# Patient Record
Sex: Male | Born: 1970 | Race: Black or African American | Hispanic: No | Marital: Single | Smoking: Current every day smoker
Health system: Southern US, Community
[De-identification: ages and names within clinical notes are randomized; demographics above are authoritative.]

---

## 2004-06-27 ENCOUNTER — Emergency Department: Payer: Self-pay | Admitting: Internal Medicine

## 2004-10-23 ENCOUNTER — Emergency Department: Payer: Self-pay | Admitting: Emergency Medicine

## 2005-09-15 ENCOUNTER — Emergency Department: Payer: Self-pay | Admitting: General Practice

## 2006-03-03 ENCOUNTER — Emergency Department: Payer: Self-pay | Admitting: Emergency Medicine

## 2006-03-04 ENCOUNTER — Other Ambulatory Visit: Payer: Self-pay

## 2006-10-02 ENCOUNTER — Emergency Department: Payer: Self-pay | Admitting: Emergency Medicine

## 2006-10-20 ENCOUNTER — Emergency Department: Payer: Self-pay | Admitting: Emergency Medicine

## 2008-10-27 ENCOUNTER — Emergency Department: Payer: Self-pay | Admitting: Internal Medicine

## 2008-12-23 ENCOUNTER — Ambulatory Visit: Payer: Self-pay | Admitting: Internal Medicine

## 2009-01-02 ENCOUNTER — Ambulatory Visit: Payer: Self-pay | Admitting: Oncology

## 2009-01-23 ENCOUNTER — Ambulatory Visit: Payer: Self-pay | Admitting: Internal Medicine

## 2009-01-23 ENCOUNTER — Ambulatory Visit: Payer: Self-pay | Admitting: Oncology

## 2010-11-12 ENCOUNTER — Emergency Department: Payer: Self-pay | Admitting: Unknown Physician Specialty

## 2011-07-15 ENCOUNTER — Emergency Department: Payer: Self-pay | Admitting: Emergency Medicine

## 2011-11-15 ENCOUNTER — Emergency Department: Payer: Self-pay | Admitting: Emergency Medicine

## 2012-09-29 ENCOUNTER — Emergency Department: Payer: Self-pay | Admitting: Emergency Medicine

## 2016-10-23 ENCOUNTER — Encounter: Payer: Self-pay | Admitting: Emergency Medicine

## 2016-10-23 ENCOUNTER — Emergency Department
Admission: EM | Admit: 2016-10-23 | Discharge: 2016-10-23 | Disposition: A | Discharge status: Home / Self Care | Payer: Self-pay | Attending: Emergency Medicine | Admitting: Emergency Medicine

## 2016-10-23 DIAGNOSIS — F17200 Nicotine dependence, unspecified, uncomplicated: Secondary | ICD-10-CM | POA: Insufficient documentation

## 2016-10-23 DIAGNOSIS — Y929 Unspecified place or not applicable: Secondary | ICD-10-CM | POA: Insufficient documentation

## 2016-10-23 DIAGNOSIS — S30861A Insect bite (nonvenomous) of abdominal wall, initial encounter: Secondary | ICD-10-CM | POA: Insufficient documentation

## 2016-10-23 DIAGNOSIS — W57XXXA Bitten or stung by nonvenomous insect and other nonvenomous arthropods, initial encounter: Secondary | ICD-10-CM | POA: Insufficient documentation

## 2016-10-23 DIAGNOSIS — S30860A Insect bite (nonvenomous) of lower back and pelvis, initial encounter: Secondary | ICD-10-CM | POA: Insufficient documentation

## 2016-10-23 DIAGNOSIS — Y939 Activity, unspecified: Secondary | ICD-10-CM | POA: Insufficient documentation

## 2016-10-23 DIAGNOSIS — Y999 Unspecified external cause status: Secondary | ICD-10-CM | POA: Insufficient documentation

## 2016-10-23 MED ORDER — DOXYCYCLINE HYCLATE 100 MG PO CAPS: 100.0000 mg | ORAL_CAPSULE | Freq: Two times a day (BID) | ORAL | 0 refills | Status: DC

## 2016-10-23 NOTE — ED Triage Notes (Signed)
Says pulled several  Ticks off yesterday and today and unsure if head came out.  No fever or illness

## 2016-10-23 NOTE — ED Notes (Signed)
E-signature pad not working, pt verbalized understanding of discharge information. 

## 2016-10-23 NOTE — Discharge Instructions (Signed)
Begin taking doxycycline twice a day for the next 10 days. Do not spit and prolonged time out in some light as you  could blister. Follow-up with [facility] acute-care if any continued problems. Use Benadryl Cream to area as needed for itching

## 2016-10-23 NOTE — ED Provider Notes (Signed)
[facility] Emergency Department Provider Note ____________________________________________  Time seen: 12:40 PM  I have reviewed the triage vital signs and the nursing notes.  HISTORY  Chief Complaint  Tick Removal   HPI Dillon Parker is a 46 y.o. male is here with family member with complaint of pulling off several ticks yesterday and today. He is unsure if he "got the head out". Wife with the patient today and states that when the tick was pulled out that she saw a pink area that did not appear that the head was still in. She did not look at the head but threw it on the ground and they set the tick on fire. Wife states that the tick did have a white spot on it prior to throwing it on the ground. He now has 2 areas on his back and one on his left abdomen that is extremely tender to touch. Patient is unaware of any fever.  History reviewed. No pertinent past medical history.  There are no active problems to display for this patient.   History reviewed. No pertinent surgical history.  Prior to Admission medications   Medication Sig Start Date End Date Taking? Authorizing Provider  doxycycline (VIBRAMYCIN) 100 MG capsule Take 1 capsule (100 mg total) by mouth 2 (two) times daily. 10/23/16   Tommi RumpsSummers, Rhonda L, PA-C    Allergies Patient has no allergy information on record.  No family history on file.  Social History Social History  Substance Use Topics  . Smoking status: Current Every Day Smoker  . Smokeless tobacco: Never Used  . Alcohol use Yes    Review of Systems  Constitutional: Negative for fever. Cardiovascular: Negative for chest pain. Respiratory: Negative for shortness of breath. Musculoskeletal: Negative for back pain. Skin: Positive skin rash. Neurological: Negative for headaches, focal weakness or numbness. ____________________________________________  PHYSICAL EXAM:  VITAL SIGNS: ED Triage Vitals  Enc Vitals Group     BP  10/23/16 1203 127/67     Pulse Rate 10/23/16 1203 93     Resp 10/23/16 1203 14     Temp 10/23/16 1203 98.3 F (36.8 C)     Temp Source 10/23/16 1203 Oral     SpO2 10/23/16 1203 97 %     Weight 10/23/16 1204 160 lb (72.6 kg)     Height 10/23/16 1204 6\' 1"  (1.854 m)     Head Circumference --      Peak Flow --      Pain Score 10/23/16 1203 5     Pain Loc --      Pain Edu? --      Excl. in GC? --     Constitutional: Alert and oriented. Well appearing and in no distress. Head: Normocephalic and atraumatic. Eyes: Conjunctivae are normal.  Neck: Supple.  Hematological/Lymphatic/Immunological: No cervical lymphadenopathy. Cardiovascular: Normal rate, regular rhythm. Normal distal pulses. Respiratory: Normal respiratory effort. No wheezes/rales/rhonchi. Musculoskeletal: Nontender with normal range of motion in all extremities.  Neurologic:  Normal gait.  Normal speech and language. No gross focal neurologic deficits are appreciated. Skin:  Skin is warm, dry and intact. Posterior mid back there is a very tender nodule present with skin intact. On the left abdomen there is an area with erythema suggestive of a bite. No drainage is active. Area is very tender to touch. Psychiatric: Mood and affect are normal. Patient exhibits appropriate insight and judgment.   INITIAL IMPRESSION / ASSESSMENT AND PLAN / ED COURSE  Area on the abdomen  was unroofed with an 18-gauge needle and no remaining tick particles were seen in the area. Patient was placed on doxycycline 100 mg twice a day for 10 days. Patient is encouraged to completely finish the course of antibiotics. He is to follow-up with [facility] acute-care if any continued problems.    ____________________________________________  FINAL CLINICAL IMPRESSION(S) / ED DIAGNOSES  Final diagnoses:  Tick bite of abdomen, initial encounter  Tick bite of back, initial encounter     Tommi Rumps, PA-C 10/23/16 1546    Jeanmarie Plant, MD 10/23/16 1622

## 2017-04-13 ENCOUNTER — Emergency Department (HOSPITAL_COMMUNITY): Payer: Self-pay

## 2017-04-13 ENCOUNTER — Encounter (HOSPITAL_COMMUNITY): Payer: Self-pay | Admitting: *Deleted

## 2017-04-13 ENCOUNTER — Emergency Department (HOSPITAL_COMMUNITY)
Admission: EM | Admit: 2017-04-13 | Discharge: 2017-04-13 | Disposition: A | Discharge status: Home / Self Care | Payer: Self-pay | Attending: Emergency Medicine | Admitting: Emergency Medicine

## 2017-04-13 ENCOUNTER — Other Ambulatory Visit: Payer: Self-pay

## 2017-04-13 ENCOUNTER — Emergency Department (HOSPITAL_BASED_OUTPATIENT_CLINIC_OR_DEPARTMENT_OTHER)
Admit: 2017-04-13 | Discharge: 2017-04-13 | Disposition: A | Discharge status: Home / Self Care | Payer: Self-pay | Attending: Emergency Medicine | Admitting: Emergency Medicine

## 2017-04-13 DIAGNOSIS — Z7982 Long term (current) use of aspirin: Secondary | ICD-10-CM | POA: Insufficient documentation

## 2017-04-13 DIAGNOSIS — R079 Chest pain, unspecified: Secondary | ICD-10-CM | POA: Insufficient documentation

## 2017-04-13 DIAGNOSIS — M79609 Pain in unspecified limb: Secondary | ICD-10-CM

## 2017-04-13 DIAGNOSIS — I809 Phlebitis and thrombophlebitis of unspecified site: Secondary | ICD-10-CM

## 2017-04-13 DIAGNOSIS — F17210 Nicotine dependence, cigarettes, uncomplicated: Secondary | ICD-10-CM | POA: Insufficient documentation

## 2017-04-13 DIAGNOSIS — I8001 Phlebitis and thrombophlebitis of superficial vessels of right lower extremity: Secondary | ICD-10-CM | POA: Insufficient documentation

## 2017-04-13 LAB — CBC
HCT: 41.9 % (ref 39.0–52.0)
Hemoglobin: 13.5 g/dL (ref 13.0–17.0)
MCH: 29.5 pg (ref 26.0–34.0)
MCHC: 32.2 g/dL (ref 30.0–36.0)
MCV: 91.5 fL (ref 78.0–100.0)
PLATELETS: 303 10*3/uL (ref 150–400)
RBC: 4.58 MIL/uL (ref 4.22–5.81)
RDW: 13 % (ref 11.5–15.5)
WBC: 9.4 10*3/uL (ref 4.0–10.5)

## 2017-04-13 LAB — BASIC METABOLIC PANEL
Anion gap: 4 — ABNORMAL LOW (ref 5–15)
CALCIUM: 8.9 mg/dL (ref 8.9–10.3)
CHLORIDE: 107 mmol/L (ref 101–111)
CO2: 28 mmol/L (ref 22–32)
CREATININE: 1.02 mg/dL (ref 0.61–1.24)
GFR calc non Af Amer: >60 mL/min (ref >60)
Glucose, Bld: 100 mg/dL — ABNORMAL HIGH (ref 65–99)
Potassium: 4.3 mmol/L (ref 3.5–5.1)
SODIUM: 139 mmol/L (ref 135–145)

## 2017-04-13 LAB — I-STAT TROPONIN, ED: TROPONIN I, POC: 0 ng/mL (ref 0.00–0.08)

## 2017-04-13 MED ORDER — ASPIRIN EC 325 MG PO TBEC: 325.0000 mg | DELAYED_RELEASE_TABLET | Freq: Four times a day (QID) | ORAL | 0 refills | Status: AC | PRN

## 2017-04-13 NOTE — ED Triage Notes (Signed)
Pt also states he has been having chest pain that comes and goes

## 2017-04-13 NOTE — ED Triage Notes (Signed)
Pt is here with bite to right foot one on each ankle.  Pt has hard vein for the last 2 weeks and now his right right knee hurt.  Palpable DPP.

## 2017-04-13 NOTE — Progress Notes (Signed)
*  PRELIMINARY RESULTS* Vascular Ultrasound Right lower extremity venous duplex has been completed.  Preliminary findings: No evidence of deep vein thrombosis or baker's cyst in the right lower extremity.  Large segment of superficial vein thrombosis noted in the right greater saphenous vein from mid upper thigh to mid calf.  Preliminary resutls given to patients RN @ 19:15  Chauncey FischerCharlotte C Cashe Gatt 04/13/2017, 7:13 PM

## 2017-04-13 NOTE — ED Provider Notes (Signed)
[facility] EMERGENCY DEPARTMENT Provider Note   CSN: [id number] Arrival date & time: 04/13/17  1252     History   Chief Complaint Chief Complaint  Patient presents with  . Chest Pain  . Knee Pain    HPI Dillon Parker is a 46 y.o. male.  HPI Pt states he started having pain after getting a bite on his leg around the ankles.  He noticed some drainage.  The drainage and swelling resolved but then his vein started to look dark and tender.    No shortness of breath.  No fever.   He has had some intermittent chest pain.   Sharp pain on the left side of his chest that lasts a few minutes at a time. Nothing seems to bring it on.  Exertion does not bother hi m. History reviewed. No pertinent past medical history.  There are no active problems to display for this patient.   History reviewed. No pertinent surgical history.     Home Medications    Prior to Admission medications   Medication Sig Start Date End Date Taking? Authorizing Provider  ibuprofen (ADVIL,MOTRIN) 200 MG tablet Take 400 mg by mouth every 6 (six) hours as needed for headache.   Yes [provider]  aspirin EC 325 MG tablet Take 1 tablet (325 mg total) by mouth every 6 (six) hours as needed. 04/13/17   Linwood DibblesKnapp, Cord Wilczynski, MD  doxycycline (VIBRAMYCIN) 100 MG capsule Take 1 capsule (100 mg total) by mouth 2 (two) times daily. Patient not taking: Reported on 04/13/2017 10/23/16   Tommi RumpsSummers, Rhonda L, PA-C    Family History No family history on file.  Social History Social History   Tobacco Use  . Smoking status: Current Every Day Smoker  . Smokeless tobacco: Never Used  Substance Use Topics  . Alcohol use: Yes    Comment: daily  . Drug use: Yes    Types: Marijuana     Allergies   Patient has no known allergies.   Review of Systems Review of Systems  All other systems reviewed and are negative.    Physical Exam Updated Vital Signs BP 117/75 (BP Location: Right Arm)   Pulse 66    Temp 98.5 F (36.9 C) (Oral)   Resp 14   Ht 1.829 m (6')   Wt 72.6 kg (160 lb)   SpO2 98%   BMI 21.70 kg/m   Physical Exam  Constitutional: He appears well-developed and well-nourished. No distress.  HENT:  Head: Normocephalic and atraumatic.  Right Ear: External ear normal.  Left Ear: External ear normal.  Eyes: Conjunctivae are normal. Right eye exhibits no discharge. Left eye exhibits no discharge. No scleral icterus.  Neck: Neck supple. No tracheal deviation present.  Cardiovascular: Normal rate, regular rhythm and intact distal pulses.  Pulmonary/Chest: Effort normal and breath sounds normal. No stridor. No respiratory distress. He has no wheezes. He has no rales.  Abdominal: Soft. Bowel sounds are normal. He exhibits no distension. There is no tenderness. There is no rebound and no guarding.  Musculoskeletal: He exhibits no edema.       Right lower leg: He exhibits tenderness. He exhibits no edema.  Indurated varicose veins anterior and medial aspect lower leg on right, ttp, mild ttp calf, no gross edema  Neurological: He is alert. He has normal strength. No cranial nerve deficit (no facial droop, extraocular movements intact, no slurred speech) or sensory deficit. He exhibits normal muscle tone. He displays no seizure  activity. Coordination normal.  Skin: Skin is warm and dry. No rash noted.  Psychiatric: He has a normal mood and affect.  Nursing note and vitals reviewed.    ED Treatments / Results  Labs (all labs ordered are listed, but only abnormal results are displayed) Labs Reviewed  BASIC METABOLIC PANEL - Abnormal; Notable for the following components:      Result Value   Glucose, Bld 100 (*)    BUN <5 (*)    Anion gap 4 (*)    All other components within normal limits  CBC  I-STAT TROPONIN, ED    EKG  EKG Interpretation  Date/Time:  Tuesday April 13 2017 14:06:07 EST Ventricular Rate:  69 PR Interval:  116 QRS Duration: 80 QT Interval:  382 QTC  Calculation: 409 R Axis:   74 Text Interpretation:  Normal sinus rhythm Normal ECG When compared with ECG of 03/04/2006, No significant change was found Confirmed by Dione Booze ([id number]) on 04/13/2017 3:45:55 PM       Radiology Dg Chest 2 View  Result Date: 04/13/2017 CLINICAL DATA:  Chest pain EXAM: CHEST  2 VIEW COMPARISON:  None. FINDINGS: Lungs are clear. The heart size and pulmonary vascularity are normal. No adenopathy. No pneumothorax. No bone lesions. A tiny metallic foreign body is noted in the anterior chest wall on the left medially. IMPRESSION: No edema or consolidation. Electronically Signed   By: Bretta Bang III M.D.   On: 04/13/2017 14:38    Procedures Procedures (including critical care time)  Medications Ordered in ED Medications - No data to display   Initial Impression / Assessment and Plan / ED Course  I have reviewed the triage vital signs and the nursing notes.  Pertinent labs & imaging results that were available during my care of the patient were reviewed by me and considered in my medical decision making (see chart for details).   Patient's laboratory tests are reassuring.  His Doppler study demonstrates a superficial vein thrombosis but no evidence of DVT.  No signs of infection. plan on discharge home with aspirin.  Outpatient follow-up with the primary care doctor.  Final Clinical Impressions(s) / ED Diagnoses   Final diagnoses:  Thrombophlebitis    ED Discharge Orders        Ordered    aspirin EC 325 MG tablet  Every 6 hours PRN     04/13/17 1942       Linwood Dibbles, MD 04/13/17 1943

## 2017-04-13 NOTE — Discharge Instructions (Signed)
Take the aspirin as prescribed, the sx should resolve over the next or two, follow up with a primary care doctor if it is not improving

## 2018-07-18 ENCOUNTER — Encounter: Payer: Self-pay | Admitting: Emergency Medicine

## 2018-07-18 ENCOUNTER — Other Ambulatory Visit: Payer: Self-pay

## 2018-07-18 ENCOUNTER — Emergency Department: Payer: Self-pay

## 2018-07-18 ENCOUNTER — Emergency Department
Admission: EM | Admit: 2018-07-18 | Discharge: 2018-07-18 | Disposition: A | Discharge status: Home / Self Care | Payer: Self-pay | Attending: Student in an Organized Health Care Education/Training Program | Admitting: Student in an Organized Health Care Education/Training Program

## 2018-07-18 DIAGNOSIS — G8929 Other chronic pain: Secondary | ICD-10-CM

## 2018-07-18 DIAGNOSIS — M5442 Lumbago with sciatica, left side: Secondary | ICD-10-CM | POA: Insufficient documentation

## 2018-07-18 DIAGNOSIS — R52 Pain, unspecified: Secondary | ICD-10-CM

## 2018-07-18 DIAGNOSIS — F17200 Nicotine dependence, unspecified, uncomplicated: Secondary | ICD-10-CM | POA: Insufficient documentation

## 2018-07-18 LAB — URINALYSIS, ROUTINE W REFLEX MICROSCOPIC
BILIRUBIN URINE: NEGATIVE
GLUCOSE, UA: NEGATIVE mg/dL
Hgb urine dipstick: NEGATIVE
Ketones, ur: NEGATIVE mg/dL
Leukocytes,Ua: NEGATIVE
Nitrite: NEGATIVE
PROTEIN: NEGATIVE mg/dL
SPECIFIC GRAVITY, URINE: 1.015 (ref 1.005–1.030)
pH: 6 (ref 5.0–8.0)

## 2018-07-18 MED ORDER — NAPROXEN 500 MG PO TABS
500.0000 mg | ORAL_TABLET | Freq: Once | ORAL | Status: AC
  Administered 2018-07-18: 500 mg via ORAL
  Filled 2018-07-18: qty 1

## 2018-07-18 MED ORDER — HYDROCODONE-ACETAMINOPHEN 5-325 MG PO TABS
1.0000 | ORAL_TABLET | Freq: Once | ORAL | Status: AC
Start: 2018-07-18 — End: 2018-07-18
  Administered 2018-07-18: 1 via ORAL
  Filled 2018-07-18: qty 1

## 2018-07-18 MED ORDER — NAPROXEN 500 MG PO TABS: 500.0000 mg | ORAL_TABLET | Freq: Two times a day (BID) | ORAL | 0 refills | Status: AC

## 2018-07-18 MED ORDER — LIDOCAINE 5 % EX PTCH
1.0000 | MEDICATED_PATCH | CUTANEOUS | Status: DC
  Administered 2018-07-18: 1 via TRANSDERMAL
  Filled 2018-07-18: qty 1

## 2018-07-18 MED ORDER — HYDROCODONE-ACETAMINOPHEN 5-325 MG PO TABS: 1.0000 | ORAL_TABLET | ORAL | 0 refills | Status: DC | PRN

## 2018-07-18 MED ORDER — PREDNISONE 20 MG PO TABS: 40.0000 mg | ORAL_TABLET | Freq: Every day | ORAL | 0 refills | Status: DC

## 2018-07-18 MED ORDER — NAPROXEN 500 MG PO TABS: 500.0000 mg | ORAL_TABLET | Freq: Two times a day (BID) | ORAL | 0 refills | Status: DC

## 2018-07-18 MED ORDER — PREDNISONE 20 MG PO TABS: 40.0000 mg | ORAL_TABLET | Freq: Every day | ORAL | 0 refills | Status: AC

## 2018-07-18 NOTE — ED Provider Notes (Signed)
[facility] Emergency Department Provider Note    First MD Initiated Contact with Patient 07/18/18 1228     (approximate)  I have reviewed the triage vital signs and the nursing notes.   HISTORY  Chief Complaint Back Pain and Hip Pain    HPI Dillon Parker is a 48 y.o. male with no significant past medical history presents the ER for evaluation of left low back pain and buttock pain.  States has had several months and years of this pain but it became worse on Friday.  Denies any heavy lifting or trauma.  No loss of bowel or bladder control.  No numbness or tingling.  States it is worsened by movement and walking.  No fevers.  No nausea or vomiting.    History reviewed. No pertinent past medical history. No family history on file. History reviewed. No pertinent surgical history. There are no active problems to display for this patient.     Prior to Admission medications   Medication Sig Start Date End Date Taking? Authorizing Provider  aspirin EC 325 MG tablet Take 1 tablet (325 mg total) by mouth every 6 (six) hours as needed. 04/13/17   Linwood Dibbles, MD  doxycycline (VIBRAMYCIN) 100 MG capsule Take 1 capsule (100 mg total) by mouth 2 (two) times daily. Patient not taking: Reported on 04/13/2017 10/23/16   Tommi Rumps, PA-C  HYDROcodone-acetaminophen [facility]) 5-325 MG tablet Take 1 tablet by mouth every 4 (four) hours as needed for moderate pain. 07/18/18   Willy Eddy, MD  ibuprofen (ADVIL,MOTRIN) 200 MG tablet Take 400 mg by mouth every 6 (six) hours as needed for headache.    [provider]  naproxen (NAPROSYN) 500 MG tablet Take 1 tablet (500 mg total) by mouth 2 (two) times daily with a meal. 07/18/18 07/18/19  Willy Eddy, MD  predniSONE (DELTASONE) 20 MG tablet Take 2 tablets (40 mg total) by mouth daily for 5 days. 07/18/18 07/23/18  Willy Eddy, MD    Allergies Patient has no known allergies.    Social  History Social History   Tobacco Use  . Smoking status: Current Every Day Smoker  . Smokeless tobacco: Never Used  Substance Use Topics  . Alcohol use: Yes    Comment: daily  . Drug use: Yes    Types: Marijuana    Review of Systems Patient denies headaches, rhinorrhea, blurry vision, numbness, shortness of breath, chest pain, edema, cough, abdominal pain, nausea, vomiting, diarrhea, dysuria, fevers, rashes or hallucinations unless otherwise stated above in HPI. ____________________________________________   PHYSICAL EXAM:  VITAL SIGNS: Vitals:   07/18/18 1037 07/18/18 1250  BP: (!) 142/98 (!) 140/98  Pulse: 78 85  Resp: 16 18  Temp: 98.2 F (36.8 C)   SpO2: 100% 100%    Constitutional: Alert and oriented.  Eyes: Conjunctivae are normal.  Head: Atraumatic. Nose: No congestion/rhinnorhea. Mouth/Throat: Mucous membranes are moist.   Neck: No stridor. Painless ROM.  Cardiovascular: Normal rate, regular rhythm. Grossly normal heart sounds.  Good peripheral circulation. Respiratory: Normal respiratory effort.  No retractions. Lungs CTAB. Gastrointestinal: Soft and nontender. No distention. No abdominal bruits. No CVA tenderness. Genitourinary:  Musculoskeletal: No lower extremity tenderness nor edema.  No joint effusions. Neurologic:  Normal speech and language. No gross focal neurologic deficits are appreciated. No facial droop Skin:  Skin is warm, dry and intact. No rash noted. Psychiatric: Mood and affect are normal. Speech and behavior are normal.  ____________________________________________   LABS (all labs  ordered are listed, but only abnormal results are displayed)  Results for orders placed or performed during the hospital encounter of 07/18/18 (from the past 24 hour(s))  Urinalysis, Routine w reflex microscopic     Status: Abnormal   Collection Time: 07/18/18 10:40 AM  Result Value Ref Range   Color, Urine YELLOW (A) YELLOW   APPearance CLEAR (A) CLEAR    Specific Gravity, Urine 1.015 1.005 - 1.030   pH 6.0 5.0 - 8.0   Glucose, UA NEGATIVE NEGATIVE mg/dL   Hgb urine dipstick NEGATIVE NEGATIVE   Bilirubin Urine NEGATIVE NEGATIVE   Ketones, ur NEGATIVE NEGATIVE mg/dL   Protein, ur NEGATIVE NEGATIVE mg/dL   Nitrite NEGATIVE NEGATIVE   Leukocytes,Ua NEGATIVE NEGATIVE   ____________________________________________ ____________________________________________  RADIOLOGY  I personally reviewed all radiographic images ordered to evaluate for the above acute complaints and reviewed radiology reports and findings.  These findings were personally discussed with the patient.  Please see medical record for radiology report.  ____________________________________________   PROCEDURES  Procedure(s) performed:  Procedures    Critical Care performed: no ____________________________________________   INITIAL IMPRESSION / ASSESSMENT AND PLAN / ED COURSE  Pertinent labs & imaging results that were available during my care of the patient were reviewed by me and considered in my medical decision making (see chart for details).   DDX: lumbago, sciatica, msk strain, sacroileitis, doubt CE, SS, Epidural abscess  Dillon Parker is a 48 y.o. who presents to the ED with acute on chronic left low back pain. No history of injury or trauma. No recent back instrumentation/procedures. No fevers. Denies cord compression symptoms. No bowel/bladder incontinence or retention, no LE weakness. VSS in ED. Exam with no LE weakness bilat., no sensory deficits, normal DTRs, no clonus, no saddle anesthesia. Pain with palpation of back and with trunk movement. Likely MSK related pain, probable lumbar strain.  History and physical exam less consistent with kidney stone or pyelonephritis. Treatments will include observation, analgesia, and arrange appropriate follow up for recheck.  Clinical picture is not consistent with epidural abscess , fracture, or cauda equina syndrome.  Plan supportive care, follow up for recheck   Clinical Course as of Jul 18 1357  Mon Jul 18, 2018  1353 No evidence of fracture or lytic lesions.  Clinically this is consistent with sciatica.  Patient able to ambulate.  Will give outpatient referral.   [PR]    Clinical Course User Index [PR] Willy Eddy, MD     As part of my medical decision making, I reviewed the following data within the electronic MEDICAL RECORD NUMBER Nursing notes reviewed and incorporated, Labs reviewed, notes from prior ED visits and [place] Controlled Substance Database   ____________________________________________   FINAL CLINICAL IMPRESSION(S) / ED DIAGNOSES  Final diagnoses:  Chronic left-sided low back pain with left-sided sciatica      NEW MEDICATIONS STARTED DURING THIS VISIT:  New Prescriptions   HYDROCODONE-ACETAMINOPHEN (NORCO) 5-325 MG TABLET    Take 1 tablet by mouth every 4 (four) hours as needed for moderate pain.   NAPROXEN (NAPROSYN) 500 MG TABLET    Take 1 tablet (500 mg total) by mouth 2 (two) times daily with a meal.   PREDNISONE (DELTASONE) 20 MG TABLET    Take 2 tablets (40 mg total) by mouth daily for 5 days.     Note:  This document was prepared using Dragon voice recognition software and may include unintentional dictation errors.    Willy Eddy, MD 07/18/18 1359

## 2018-07-18 NOTE — ED Triage Notes (Signed)
Says pain in low back and down left hip for couple years.  He also has difficulty with urination for years as well.  Says he has not had his prostate check and says his hormones are messed up.  Says he came today because his back got stiffer.

## 2018-07-18 NOTE — ED Notes (Signed)
First Nurse Note: Patient states he has trouble going to BR for months.  Here this AM complaining of hip and back pain, and "my hormones are acting up".

## 2018-07-18 NOTE — ED Notes (Signed)
ED Provider at bedside. 

## 2018-07-18 NOTE — ED Notes (Signed)
Patient transported to X-ray 

## 2018-07-18 NOTE — Discharge Instructions (Addendum)

## 2019-07-27 IMAGING — CR DG LUMBAR SPINE 2-3V
1 series · 3 of 3 positions shown · non-contrast
Comparison: CT lumbar spine November 12, 2010

CLINICAL DATA: Chronic pain

EXAM:
LUMBAR SPINE - 2-3 VIEW

[Series 1: dg lumbar spine 2-3 views · 0.14mm/px · 3 of 3 slices shown]
[im 1/3]
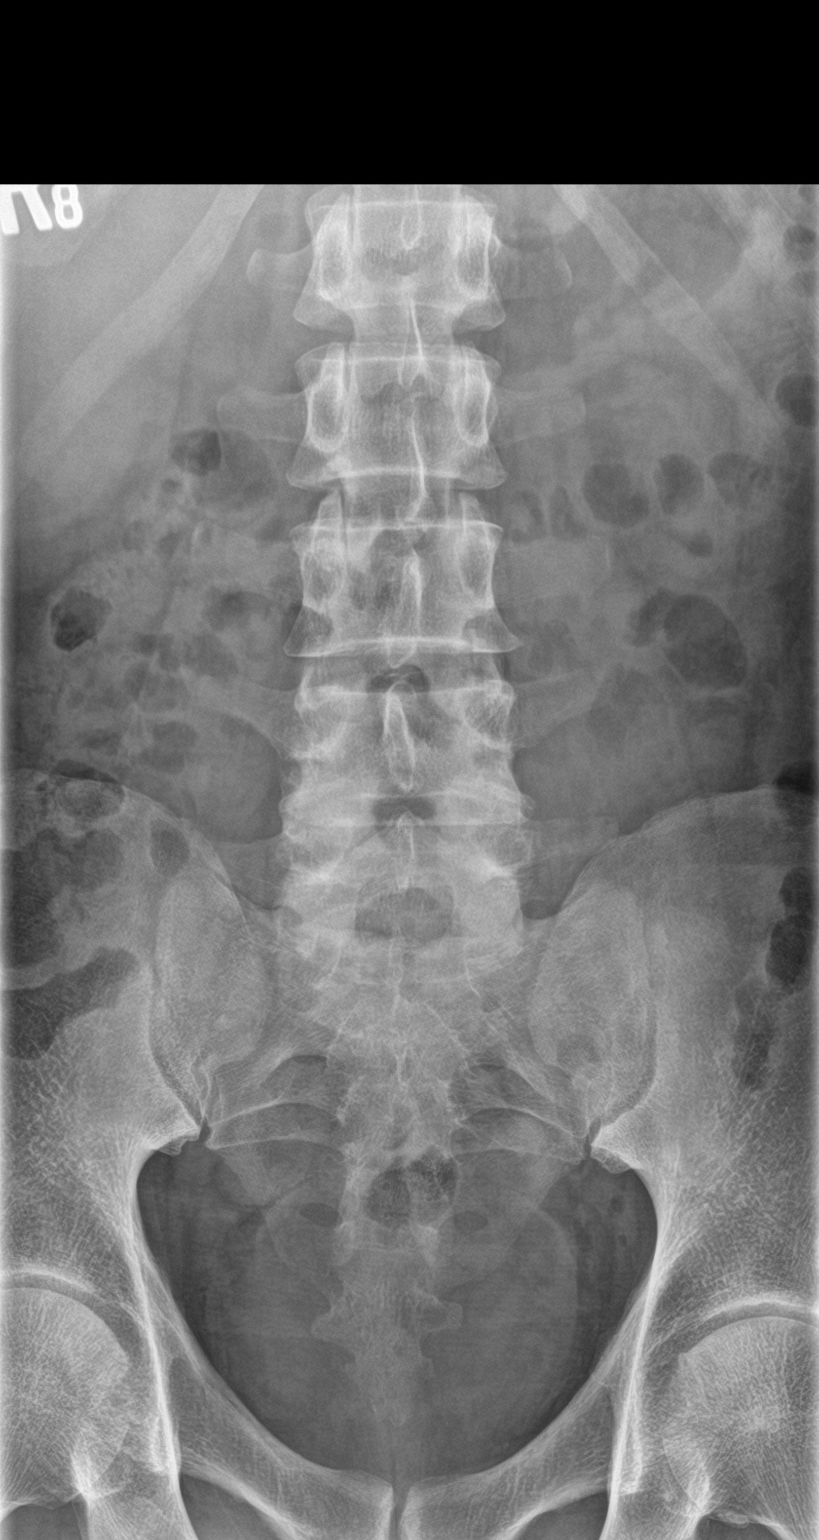
[im 2/3]
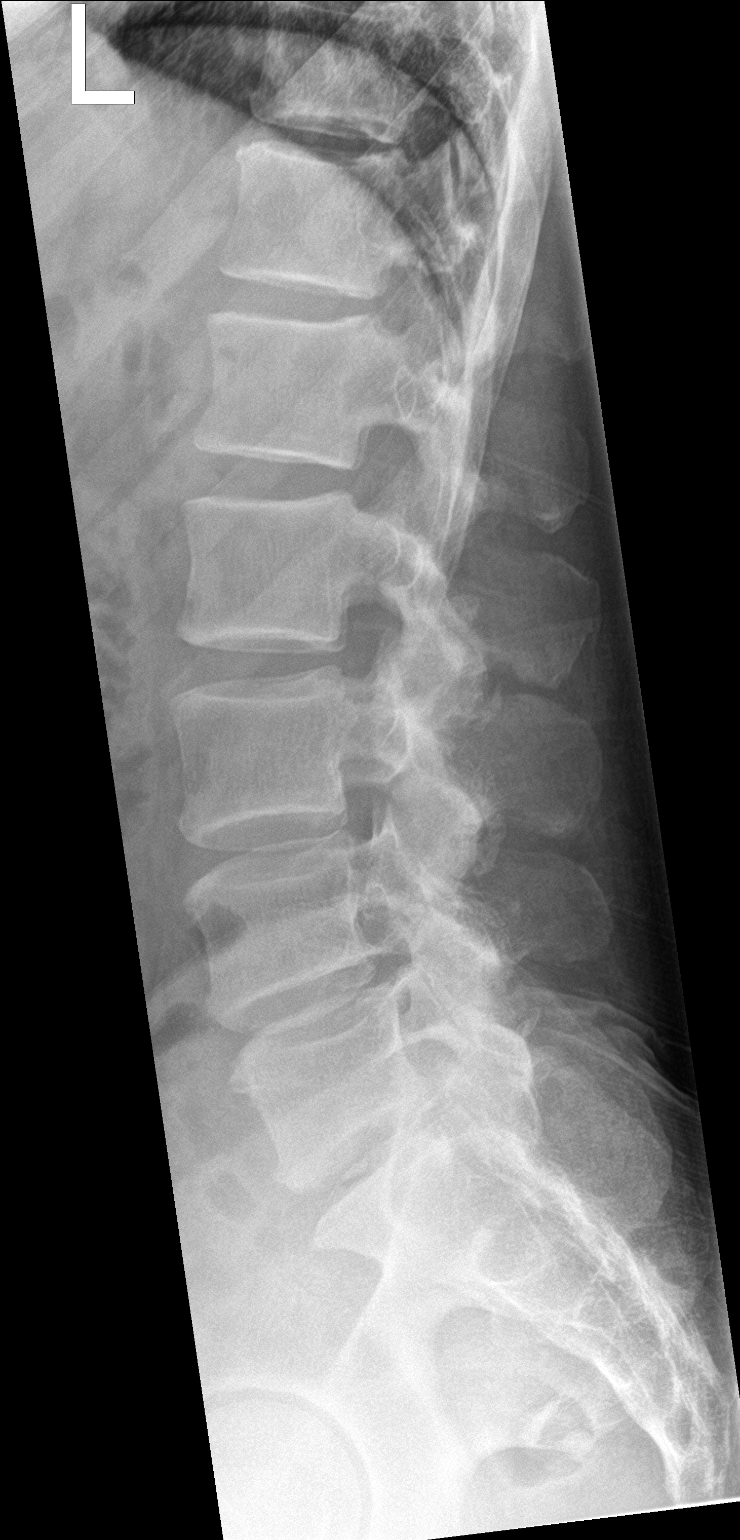
[im 3/3]
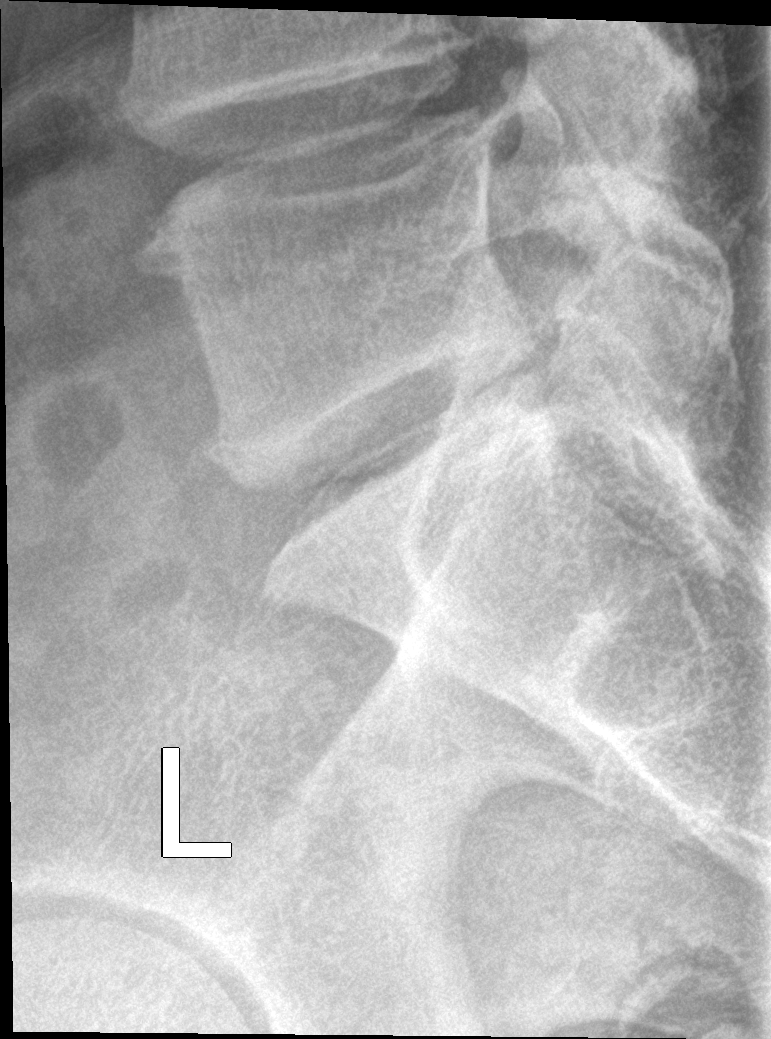

[3 of 3 positions shown; findings below may reference images not displayed]

FINDINGS: Frontal, lateral, and spot lumbosacral lateral images were obtained.
There are 5 non-rib-bearing lumbar type vertebral bodies. There is
mild disc space narrowing at L4-5 and L5-S1, progressed from prior
study. No erosive change.
IMPRESSION: Mild disc space narrowing at L4-5 and L5-S1. No fracture or
spondylolisthesis.

## 2020-03-19 ENCOUNTER — Other Ambulatory Visit (HOSPITAL_COMMUNITY): Payer: Self-pay | Admitting: Orthopedic Surgery

## 2020-03-19 ENCOUNTER — Other Ambulatory Visit: Payer: Self-pay | Admitting: Orthopedic Surgery

## 2020-03-19 DIAGNOSIS — M4802 Spinal stenosis, cervical region: Secondary | ICD-10-CM

## 2020-03-19 DIAGNOSIS — M542 Cervicalgia: Secondary | ICD-10-CM

## 2021-01-14 ENCOUNTER — Other Ambulatory Visit: Payer: Self-pay | Admitting: Orthopedic Surgery

## 2021-01-14 DIAGNOSIS — M5416 Radiculopathy, lumbar region: Secondary | ICD-10-CM

## 2021-01-14 DIAGNOSIS — M5136 Other intervertebral disc degeneration, lumbar region: Secondary | ICD-10-CM

## 2021-01-14 DIAGNOSIS — M5126 Other intervertebral disc displacement, lumbar region: Secondary | ICD-10-CM

## 2021-01-28 ENCOUNTER — Other Ambulatory Visit: Payer: Self-pay

## 2021-01-28 ENCOUNTER — Ambulatory Visit
Admission: RE | Admit: 2021-01-28 | Discharge: 2021-01-28 | Disposition: A | Discharge status: Home / Self Care | Payer: Self-pay | Source: Ambulatory Visit | Attending: Orthopedic Surgery | Admitting: Orthopedic Surgery

## 2021-01-28 DIAGNOSIS — M5416 Radiculopathy, lumbar region: Secondary | ICD-10-CM | POA: Insufficient documentation

## 2021-01-28 DIAGNOSIS — M5136 Other intervertebral disc degeneration, lumbar region: Secondary | ICD-10-CM

## 2021-01-28 DIAGNOSIS — M5126 Other intervertebral disc displacement, lumbar region: Secondary | ICD-10-CM | POA: Insufficient documentation

## 2022-08-20 ENCOUNTER — Other Ambulatory Visit: Payer: Self-pay | Admitting: Family Medicine

## 2022-08-20 DIAGNOSIS — N63 Unspecified lump in unspecified breast: Secondary | ICD-10-CM

## 2022-08-25 ENCOUNTER — Ambulatory Visit
Admission: RE | Admit: 2022-08-25 | Discharge: 2022-08-25 | Disposition: A | Discharge status: Home / Self Care | Payer: Medicaid Other | Source: Ambulatory Visit | Attending: Family Medicine | Admitting: Family Medicine

## 2022-08-25 DIAGNOSIS — N63 Unspecified lump in unspecified breast: Secondary | ICD-10-CM

## 2023-03-18 ENCOUNTER — Other Ambulatory Visit: Payer: Self-pay | Admitting: Neurosurgery

## 2023-03-18 DIAGNOSIS — M5416 Radiculopathy, lumbar region: Secondary | ICD-10-CM

## 2023-03-20 ENCOUNTER — Ambulatory Visit
Admission: RE | Admit: 2023-03-20 | Discharge: 2023-03-20 | Disposition: A | Discharge status: Home / Self Care | Payer: Medicaid Other | Source: Ambulatory Visit | Attending: Neurosurgery

## 2023-03-20 DIAGNOSIS — M5416 Radiculopathy, lumbar region: Secondary | ICD-10-CM | POA: Insufficient documentation

## 2024-02-15 ENCOUNTER — Encounter: Payer: Self-pay | Admitting: Urology

## 2024-02-15 ENCOUNTER — Ambulatory Visit (INDEPENDENT_AMBULATORY_CARE_PROVIDER_SITE_OTHER): Admitting: Urology

## 2024-02-15 VITALS — BP 92/58 | HR 81 | Ht 69.0 in | Wt 170.0 lb

## 2024-02-15 DIAGNOSIS — N5201 Erectile dysfunction due to arterial insufficiency: Secondary | ICD-10-CM

## 2024-02-15 DIAGNOSIS — R6882 Decreased libido: Secondary | ICD-10-CM | POA: Diagnosis not present

## 2024-02-15 DIAGNOSIS — R7989 Other specified abnormal findings of blood chemistry: Secondary | ICD-10-CM

## 2024-02-15 MED ORDER — SILDENAFIL CITRATE 100 MG PO TABS: ORAL_TABLET | ORAL | 0 refills | Status: DC

## 2024-02-15 NOTE — Progress Notes (Signed)
   02/15/2024 9:07 AM   Genevieve GORMAN Shallow 02-Aug-1970 [id number]  Referring provider: Sameul Debby DEL, MD [address] [place],  [place] [address]  Chief Complaint  Patient presents with   Erectile Dysfunction    HPI: Dillon Parker is a 53 y.o. male referred for evaluation of erectile dysfunction.  3-year history of difficulty achieving and maintaining an erection.  Has partial erections which are rarely firm enough for penetration.  The times he is able to achieve an erection he does has difficulty maintaining the erection ~50% of the time. SHIM was 8/25 indicating moderate ED Organic risk factors: Tobacco use, hypertension, antihypertensive medications No pain or curvature with erections Does note decreased libido Tried sildenafil  20 mg which was not effective; tadalafil 20 mg was not effective  PMH: History reviewed. No pertinent past medical history.  Surgical History: History reviewed. No pertinent surgical history.  Home Medications:  Allergies as of 02/15/2024       Reactions   Hydrocodone  Itching        Medication List        Accurate as of February 15, 2024  9:07 AM. If you have any questions, ask your nurse or doctor.          STOP taking these medications    doxycycline  100 MG capsule Commonly known as: VIBRAMYCIN  Stopped by: Azuri Bozard C Bobbie Virden   HYDROcodone -acetaminophen  5-325 MG tablet Commonly known as: Norco Stopped by: Glendia JAYSON Barba       TAKE these medications    aspirin  EC 325 MG tablet Take 1 tablet (325 mg total) by mouth every 6 (six) hours as needed.   ibuprofen 200 MG tablet Commonly known as: ADVIL Take 400 mg by mouth every 6 (six) hours as needed for headache.        Allergies:  Allergies  Allergen Reactions   Hydrocodone  Itching    Family History: History reviewed. No pertinent family history.  Social History:  reports that he has been smoking. He has never used smokeless tobacco. He reports current  alcohol use. He reports current drug use. Drug: Marijuana.   Physical Exam: BP (!) 92/58   Pulse 81   Ht 5' 9 (1.753 m)   Wt 170 lb (77.1 kg)   BMI 25.10 kg/m   Constitutional:  Alert, No acute distress. HEENT: [place] AT Respiratory: Normal respiratory effort, no increased work of breathing. GU: Phallus without lesions/plaques.  Testes descended bilaterally without masses or tenderness Psychiatric: Normal mood and affect.   Assessment & Plan:    1.  Erectile dysfunction Significant organic risk factors Trial sildenafil  100 mg-Rx sent to pharmacy Testosterone  level drawn  2.  Decreased libido Testosterone  level as above   Glendia JAYSON Barba, MD  [facility] [address] [place], [place] [address] [phone]

## 2024-02-16 ENCOUNTER — Ambulatory Visit: Payer: Self-pay | Admitting: Urology

## 2024-02-16 DIAGNOSIS — R6882 Decreased libido: Secondary | ICD-10-CM

## 2024-02-16 LAB — TESTOSTERONE: Testosterone: 188 ng/dL — ABNORMAL LOW (ref 264–916)

## 2024-02-16 NOTE — Progress Notes (Signed)
 No voice mail.

## 2024-02-20 ENCOUNTER — Encounter: Payer: Self-pay | Admitting: Urology

## 2024-02-25 ENCOUNTER — Other Ambulatory Visit

## 2024-02-25 DIAGNOSIS — R6882 Decreased libido: Secondary | ICD-10-CM

## 2024-02-26 LAB — LUTEINIZING HORMONE: LH: 6.8 m[IU]/mL (ref 1.7–8.6)

## 2024-02-26 LAB — TESTOSTERONE: Testosterone: 298 ng/dL (ref 264–916)

## 2024-02-27 ENCOUNTER — Ambulatory Visit: Payer: Self-pay | Admitting: Urology

## 2024-03-09 ENCOUNTER — Telehealth: Payer: Self-pay

## 2024-03-09 ENCOUNTER — Other Ambulatory Visit: Payer: Self-pay

## 2024-03-09 MED ORDER — CLOMIPHENE CITRATE 50 MG PO TABS: 25.0000 mg | ORAL_TABLET | Freq: Every day | ORAL | 0 refills | Status: DC

## 2024-03-09 NOTE — Telephone Encounter (Signed)
 Pt LM on triage line requesting Clomid dose ,strength,  directions.  Did send via my chart earlier today.   Pt aware Clomid 50 mg take 1/2 tab daily # 15.   Per SV ok to send pharmacy.  [facility].   Pt aware. Informed pt that insurance will likely not cover med. Sent good rx infor.

## 2024-03-20 ENCOUNTER — Other Ambulatory Visit: Payer: Self-pay

## 2024-03-20 MED ORDER — CLOMIPHENE CITRATE 50 MG PO TABS: 25.0000 mg | ORAL_TABLET | Freq: Every day | ORAL | 1 refills | Status: DC

## 2024-03-22 ENCOUNTER — Telehealth: Payer: Self-pay

## 2024-03-22 NOTE — Telephone Encounter (Signed)
 Patient returned missed call. I relayed message to him, and also let him know it was sent to him through mychart. Patient verbalized understanding.

## 2024-03-22 NOTE — Telephone Encounter (Signed)
 Pt LM stating we need  to send his meds to [facility] in [place].   Med was sent to [facility] in [place] on 10/27. Verified with the pharmacist they do have the rx. M[employer] will not cover. Cost is 218.49.   CX rx with [facility].   Called pt - n/a.  Will send my chart message.

## 2024-04-07 ENCOUNTER — Encounter: Payer: Self-pay | Admitting: *Deleted

## 2024-04-19 ENCOUNTER — Other Ambulatory Visit: Payer: Self-pay | Admitting: Urology

## 2024-05-02 ENCOUNTER — Telehealth: Payer: Self-pay | Admitting: Acute Care

## 2024-05-02 NOTE — Telephone Encounter (Signed)
 Lung Cancer Screening Narrative/Criteria Questionnaire (Cigarette Smokers Only- No Cigars/Pipes/vapes)   Dillon Parker   SDMV:05/16/24@4pm //[facility]                                           1970/10/11              LDCT: [facility] on 05/31/24 at 8am    53 y.o.   Phone: [phone]  Lung Screening Narrative (confirm age 54-77 yrs Medicare / 50-80 yrs Private pay insurance)   Insurance information:Medicaid   Referring Provider:[facility]   This screening involves an initial phone call with a team member from our program. It is called a shared decision making visit. The initial meeting is required by insurance and Medicare to make sure you understand the program. This appointment takes about 15-20 minutes to complete. The CT scan will completed at a separate date/time. This scan takes about 5-10 minutes to complete and you may eat and drink before and after the scan.  Criteria questions for Lung Cancer Screening:   Are you a current or former smoker? Current Age began smoking: 16y   If you are a former smoker, what year did you quit smoking? Quit once for a yr   To calculate your smoking history, I need an accurate estimate of how many packs of cigarettes you smoked per day and for how many years. (Not just the number of PPD you are now smoking)   Years smoking 36 x Packs per day range 1/3 to 1 ppd = Pack years 22   (at least 20 pack yrs)   (Make sure they understand that we need to know how much they have smoked in the past, not just the number of PPD they are smoking now)  Do you have a personal history of cancer?  No    Do you have a family history of cancer? Yes  (cancer type and and relative) mother/bone  Are you coughing up blood?  No  Have you had unexplained weight loss of 15 lbs or more in the last 6 months? No  It looks like you meet all criteria.     Additional information: N/A

## 2024-05-16 ENCOUNTER — Telehealth (INDEPENDENT_AMBULATORY_CARE_PROVIDER_SITE_OTHER): Payer: Self-pay

## 2024-05-16 ENCOUNTER — Encounter

## 2024-05-16 DIAGNOSIS — Z539 Procedure and treatment not carried out, unspecified reason: Secondary | ICD-10-CM

## 2024-05-16 NOTE — Telephone Encounter (Signed)
 Attempted to call 3 times for Shared Decision Making Visit for lung cancer screening. Unable to leave voicemail as it states person you are calling is unavailable. Will reach out to reschedule SDMV, CT scan is scheduled for 05/31/2024.

## 2024-05-30 ENCOUNTER — Other Ambulatory Visit: Payer: Self-pay | Admitting: Urology

## 2024-05-31 ENCOUNTER — Other Ambulatory Visit: Payer: Self-pay

## 2024-05-31 ENCOUNTER — Ambulatory Visit: Admission: RE | Admit: 2024-05-31 | Source: Ambulatory Visit

## 2024-05-31 DIAGNOSIS — F17210 Nicotine dependence, cigarettes, uncomplicated: Secondary | ICD-10-CM

## 2024-05-31 DIAGNOSIS — Z122 Encounter for screening for malignant neoplasm of respiratory organs: Secondary | ICD-10-CM

## 2024-05-31 DIAGNOSIS — Z87891 Personal history of nicotine dependence: Secondary | ICD-10-CM

## 2024-06-08 ENCOUNTER — Ambulatory Visit: Admitting: *Deleted

## 2024-06-08 ENCOUNTER — Ambulatory Visit
Admission: RE | Admit: 2024-06-08 | Discharge: 2024-06-08 | Disposition: A | Discharge status: Home / Self Care | Source: Ambulatory Visit | Attending: Acute Care | Admitting: Acute Care

## 2024-06-08 ENCOUNTER — Encounter: Payer: Self-pay | Admitting: *Deleted

## 2024-06-08 DIAGNOSIS — Z122 Encounter for screening for malignant neoplasm of respiratory organs: Secondary | ICD-10-CM | POA: Insufficient documentation

## 2024-06-08 DIAGNOSIS — F17210 Nicotine dependence, cigarettes, uncomplicated: Secondary | ICD-10-CM | POA: Insufficient documentation

## 2024-06-08 DIAGNOSIS — J439 Emphysema, unspecified: Secondary | ICD-10-CM | POA: Diagnosis not present

## 2024-06-08 DIAGNOSIS — Z87891 Personal history of nicotine dependence: Secondary | ICD-10-CM | POA: Diagnosis present

## 2024-06-08 NOTE — Progress Notes (Signed)
 Virtual Visit via Telephone Note  I connected with Dillon Parker on 06/08/24 at  9:30 AM EST by telephone and verified that I am speaking with the correct person using two identifiers.  Location: Patient: at home Provider: [address], [place], [place], [address]    I discussed the limitations, risks, security and privacy concerns of performing an evaluation and management service by telephone and the availability of in person appointments. I also discussed with the patient that there may be a patient responsible charge related to this service. The patient expressed understanding and agreed to proceed.   Shared Decision Making Visit Lung Cancer Screening Program [phone])   Eligibility: Age 54 y.o. Pack Years Smoking History Calculation 24 (# packs/per year x # years smoked) Recent History of coughing up blood  no Unexplained weight loss? no ( >Than 15 pounds within the last 6 months ) Prior History Lung / other cancer no (Diagnosis within the last 5 years already requiring surveillance chest CT Scans). Smoking Status Current Smoker Former Smokers: Years since quit: n/a  Quit Date: n/a  Visit Components: Discussion included one or more decision making aids. yes Discussion included risk/benefits of screening. yes Discussion included potential follow up diagnostic testing for abnormal scans. yes Discussion included meaning and risk of over diagnosis. yes Discussion included meaning and risk of False Positives. yes Discussion included meaning of total radiation exposure. yes  Counseling Included: Importance of adherence to annual lung cancer LDCT screening. yes Impact of comorbidities on ability to participate in the program. yes Ability and willingness to under diagnostic treatment. yes  Smoking Cessation Counseling: Current Smokers:  Discussed importance of smoking cessation. yes Information about tobacco cessation classes and interventions provided to patient.  yes Patient provided with ticket for LDCT Scan. no Symptomatic Patient. no  Counseling(Intermediate counseling: > three minutes) [address]406 Diagnosis Code: Tobacco Use Z72.0 Asymptomatic Patient yes  Smoking/Tobacco Cessation Counseling Dillon Parker is a current user of tobacco or nicotine products. He is ready to quit at this time. Counseling provided today addressed the risks of continued use and the benefits of cessation. Discussed tobacco/nicotine use history, readiness to quit, and evidence-based treatment options including behavioral strategies, support resources, and pharmacologic therapies. Provided encouragement and educational materials on steps and resources to quit smoking. Patient questions were addressed, and follow-up recommended for continued support. Total time spent on counseling: 3 minutes.   Former Smokers:  Discussed the importance of maintaining cigarette abstinence. yes Diagnosis Code: Personal History of Nicotine Dependence. S12.108 Information about tobacco cessation classes and interventions provided to patient. Yes Patient provided with ticket for LDCT Scan. no Written Order for Lung Cancer Screening with LDCT placed in Epic. Yes (CT Chest Lung Cancer Screening Low Dose W/O CM) [id number] Z12.2-Screening of respiratory organs Z87.891-Personal history of nicotine dependence   Laneta Speaks, RN

## 2024-06-08 NOTE — Patient Instructions (Signed)

## 2024-06-19 ENCOUNTER — Other Ambulatory Visit: Payer: Self-pay

## 2024-06-19 DIAGNOSIS — F17210 Nicotine dependence, cigarettes, uncomplicated: Secondary | ICD-10-CM

## 2024-06-19 DIAGNOSIS — Z122 Encounter for screening for malignant neoplasm of respiratory organs: Secondary | ICD-10-CM

## 2024-06-19 DIAGNOSIS — Z87891 Personal history of nicotine dependence: Secondary | ICD-10-CM

## 2024-06-28 ENCOUNTER — Other Ambulatory Visit: Payer: Self-pay | Admitting: Urology

## 2024-06-29 ENCOUNTER — Other Ambulatory Visit: Payer: Self-pay | Admitting: *Deleted

## 2024-06-29 DIAGNOSIS — R6882 Decreased libido: Secondary | ICD-10-CM

## 2024-07-12 ENCOUNTER — Other Ambulatory Visit

## 2024-07-17 ENCOUNTER — Ambulatory Visit: Admitting: Urology
